# Patient Record
Sex: Male | Born: 1985 | Race: Black or African American | Hispanic: No | Marital: Single | Smoking: Never smoker
Health system: Southern US, Community
[De-identification: ages and names within clinical notes are randomized; demographics above are authoritative.]

---
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
## 2014-11-09 ENCOUNTER — Emergency Department (HOSPITAL_BASED_OUTPATIENT_CLINIC_OR_DEPARTMENT_OTHER)
Admission: EM | Admit: 2014-11-09 | Discharge: 2014-11-09 | Disposition: A | Discharge status: Home / Self Care | Payer: BLUE CROSS/BLUE SHIELD | Attending: Emergency Medicine | Admitting: Emergency Medicine

## 2014-11-09 ENCOUNTER — Emergency Department (HOSPITAL_BASED_OUTPATIENT_CLINIC_OR_DEPARTMENT_OTHER): Payer: BLUE CROSS/BLUE SHIELD

## 2014-11-09 ENCOUNTER — Encounter (HOSPITAL_BASED_OUTPATIENT_CLINIC_OR_DEPARTMENT_OTHER): Payer: Self-pay | Admitting: Emergency Medicine

## 2014-11-09 DIAGNOSIS — L03115 Cellulitis of right lower limb: Secondary | ICD-10-CM | POA: Diagnosis not present

## 2014-11-09 DIAGNOSIS — M79604 Pain in right leg: Secondary | ICD-10-CM

## 2014-11-09 MED ORDER — CEFTRIAXONE SODIUM 1 G IJ SOLR
1.0000 g | Freq: Once | INTRAMUSCULAR | Status: AC
  Administered 2014-11-09: 1 g via INTRAMUSCULAR
  Filled 2014-11-09: qty 10

## 2014-11-09 MED ORDER — LIDOCAINE HCL (PF) 1 % IJ SOLN
INTRAMUSCULAR | Status: AC
  Administered 2014-11-09: 2.1 mL
  Filled 2014-11-09: qty 5

## 2014-11-09 MED ORDER — CEPHALEXIN 500 MG PO CAPS
500.0000 mg | ORAL_CAPSULE | Freq: Four times a day (QID) | ORAL | Status: AC
Start: 2014-11-09 — End: ?

## 2014-11-09 NOTE — ED Notes (Signed)
Patient states that he has had pain to his right lower leg x 3 - 4 days. Patient has redness to his shin and calf area. The patient reports that he "might" has had an injury. The patient reports that he was at [facility] who sent him here for further testing "because they did not know"

## 2014-11-09 NOTE — Discharge Instructions (Signed)
Keflex as prescribed.  Return to the emergency department if your symptoms significantly worsen or change.   Cellulitis Cellulitis is an infection of the skin and the tissue beneath it. The infected area is usually red and tender. Cellulitis occurs most often in the arms and lower legs.  CAUSES  Cellulitis is caused by bacteria that enter the skin through cracks or cuts in the skin. The most common types of bacteria that cause cellulitis are staphylococci and streptococci. SIGNS AND SYMPTOMS   Redness and warmth.  Swelling.  Tenderness or pain.  Fever. DIAGNOSIS  Your health care provider can usually determine what is wrong based on a physical exam. Blood tests may also be done. TREATMENT  Treatment usually involves taking an antibiotic medicine. HOME CARE INSTRUCTIONS   Take your antibiotic medicine as directed by your health care provider. Finish the antibiotic even if you start to feel better.  Keep the infected arm or leg elevated to reduce swelling.  Apply a warm cloth to the affected area up to 4 times per day to relieve pain.  Take medicines only as directed by your health care provider.  Keep all follow-up visits as directed by your health care provider. SEEK MEDICAL CARE IF:   You notice red streaks coming from the infected area.  Your red area gets larger or turns dark in color.  Your bone or joint underneath the infected area becomes painful after the skin has healed.  Your infection returns in the same area or another area.  You notice a swollen bump in the infected area.  You develop new symptoms.  You have a fever. SEEK IMMEDIATE MEDICAL CARE IF:   You feel very sleepy.  You develop vomiting or diarrhea.  You have a general ill feeling (malaise) with muscle aches and pains. MAKE SURE YOU:   Understand these instructions.  Will watch your condition.  Will get help right away if you are not doing well or get worse. Document Released:  04/07/2005 Document Revised: 11/12/2013 Document Reviewed: 09/13/2011 [facility] Patient Information 2015 [employer] [place]. This information is not intended to replace advice given to you by your health care provider. Make sure you discuss any questions you have with your health care provider.

## 2014-11-09 NOTE — ED Provider Notes (Signed)
CSN: [id number]     Arrival date & time 11/09/14  1601 History  This chart was scribed for Dan Lyonsouglas Dan Heatley, MD by Dan Flores, ED Scribe. This patient was seen in room MH11/MH11 and the patient's care was started at 4:24 PM.   Chief Complaint  Patient presents with  . Leg Pain   Patient is a 29 y.o. male presenting with leg pain. The history is provided by the patient. No language interpreter was used.  Leg Pain Location:  Leg Time since incident:  3 days Injury: no   Leg location:  R lower leg Pain details:    Quality:  Aching (redness, swelling)   Radiates to:  Does not radiate   Severity:  Moderate   Onset quality:  Gradual   Timing:  Constant   Progression:  Unchanged Chronicity:  New Dislocation: no   Foreign body present:  No foreign bodies Prior injury to area:  No Relieved by:  Nothing Worsened by:  Nothing tried Ineffective treatments:  None tried Associated symptoms: swelling    HPI Comments: Dan Flores is a 29 y.o. male with no chronic medical conditions, who presents to the Emergency Department complaining of moderate pain to right lower leg with associated redness, swelling and tenderness onset 3-4 days ago. Patient denies associated radiation of pain. Patient states that he was seen by Urgent Care who reported possibility of cellulitis, but did not confirm a diagnosis. Patient was advised to be seen by ER for further testing. Patient is unsure if symptoms are due to an insect bite. He states that he had onset of a "bump at the same time below the site of swelling but I popped it".  History reviewed. No pertinent past medical history. History reviewed. No pertinent past surgical history. History reviewed. No pertinent family history. History  Substance Use Topics  . Smoking status: Never Smoker   . Smokeless tobacco: Not on file  . Alcohol Use: No   Review of Systems  A complete 10 system review of systems was obtained and all systems are negative except  as noted in the HPI and PMH.  Allergies  Review of patient's allergies indicates no known allergies.  Home Medications   Prior to Admission medications   Not on File   Triage Vitals: BP 152/81 mmHg  Pulse 55  Temp(Src) 98.2 F (36.8 C) (Oral)  Resp 18  Ht 5\' 11"  (1.803 m)  Wt 330 lb (149.687 kg)  BMI 46.05 kg/m2  SpO2 98%  Physical Exam  Constitutional: He is oriented to person, place, and time. He appears well-developed and well-nourished. No distress.  HENT:  Head: Normocephalic and atraumatic.  Eyes: Conjunctivae and EOM are normal.  Neck: Neck supple. No tracheal deviation present.  Cardiovascular: Normal rate.   Pulmonary/Chest: Effort normal. No respiratory distress.  Musculoskeletal: Normal range of motion. He exhibits edema and tenderness.  Tenderness to palpation, redness and warmth to the anterior and medial aspect of the right lower leg. DP pulses are palpable. There is no calf tenderness and Homan Sign is absent.  Neurological: He is alert and oriented to person, place, and time. No cranial nerve deficit. Coordination normal.  Skin: Skin is warm and dry.  Psychiatric: He has a normal mood and affect. His behavior is normal.  Nursing note and vitals reviewed.  ED Course  Procedures (including critical care time)  DIAGNOSTIC STUDIES: Oxygen Saturation is 98% on RA, normal by my interpretation.    COORDINATION OF CARE: 4:29 PM- Discussed  plans to order diagnostic ultrasound of right lower leg. Pt advised of plan for treatment and pt agrees.  Labs Review Labs Reviewed - No data to display  Imaging Review No results found.   EKG Interpretation None     MDM   Final diagnoses:  Right leg pain    Ultrasound reveals no evidence for DVT. This appears to be a cellulitis. We'll treat with Rocephin and Keflex and when necessary return.  I personally performed the services described in this documentation, which was scribed in my presence. The recorded  information has been reviewed and is accurate.     Dan Lyons, MD 11/09/14 [id number]

## 2014-11-09 NOTE — ED Notes (Signed)
Patient transported to Ultrasound 

## 2017-06-05 ENCOUNTER — Encounter (HOSPITAL_BASED_OUTPATIENT_CLINIC_OR_DEPARTMENT_OTHER): Payer: Self-pay | Admitting: Emergency Medicine

## 2017-06-05 ENCOUNTER — Emergency Department (HOSPITAL_BASED_OUTPATIENT_CLINIC_OR_DEPARTMENT_OTHER)
Admission: EM | Admit: 2017-06-05 | Discharge: 2017-06-05 | Disposition: A | Discharge status: Home / Self Care | Payer: Self-pay | Attending: Emergency Medicine | Admitting: Emergency Medicine

## 2017-06-05 ENCOUNTER — Other Ambulatory Visit: Payer: Self-pay

## 2017-06-05 ENCOUNTER — Emergency Department (HOSPITAL_BASED_OUTPATIENT_CLINIC_OR_DEPARTMENT_OTHER): Payer: Self-pay

## 2017-06-05 DIAGNOSIS — M25551 Pain in right hip: Secondary | ICD-10-CM | POA: Insufficient documentation

## 2017-06-05 MED ORDER — TRAMADOL HCL 50 MG PO TABS: 50.0000 mg | ORAL_TABLET | Freq: Four times a day (QID) | ORAL | 0 refills | Status: AC | PRN

## 2017-06-05 MED ORDER — IBUPROFEN 800 MG PO TABS: 800.0000 mg | ORAL_TABLET | Freq: Three times a day (TID) | ORAL | 0 refills | Status: AC | PRN

## 2017-06-05 NOTE — ED Triage Notes (Signed)
Patient states that he has had pain to his right hip and down into his right knee x 2 -3 days. Patient re[ports increased pain with movement.

## 2017-06-05 NOTE — ED Notes (Signed)
Pt given d/c instructions as per chart. Rx x 2 with precautions. Verbalizes understanding. No questions. 

## 2017-06-05 NOTE — ED Notes (Addendum)
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 [facility] No meds PTA.

## 2017-06-05 NOTE — Discharge Instructions (Signed)

## 2017-06-05 NOTE — ED Provider Notes (Signed)
Emergency Department Provider Note   I have reviewed the triage vital signs and the nursing notes.   HISTORY  Chief Complaint Hip Pain   HPI Dan Flores is a 31 y.o. male with no significant PMH presents to the ED with right hip pain for the last 3 days.  Patient states that the pain in the right hip is moderate and radiates to the right knee.  It is worse with movement.  He denies any tingling or numbness in the right.  No groin numbness.  No urinary symptoms.  He denies any buttock or lower back pain.  No injury to the leg.  He states that he was in a confined area several days ago and thinks that may have started his discomfort.  He has not been taking medications at home for pain.    History reviewed. No pertinent past medical history.  There are no active problems to display for this patient.   History reviewed. No pertinent surgical history.  Current Outpatient Rx  . Order #: [id number] Class: Print  . Order #: [id number] Class: Print  . Order #: [id number] Class: Print    Allergies Patient has no known allergies.  History reviewed. No pertinent family history.  Social History Social History   Tobacco Use  . Smoking status: Never Smoker  . Smokeless tobacco: Never Used  Substance Use Topics  . Alcohol use: No  . Drug use: No    Review of Systems  Constitutional: No fever/chills Eyes: No visual changes. ENT: No sore throat. Cardiovascular: Denies chest pain. Respiratory: Denies shortness of breath. Gastrointestinal: No abdominal pain.  No nausea, no vomiting.  No diarrhea.  No constipation. Genitourinary: Negative for dysuria. Musculoskeletal: Negative for back pain. Positive right hip and knee pain.  Skin: Negative for rash. Neurological: Negative for headaches, focal weakness or numbness.  10-point ROS otherwise negative.  ____________________________________________   PHYSICAL EXAM:  VITAL SIGNS: ED Triage Vitals  Enc Vitals Group     BP  06/05/17 1951 (!) 152/81     Pulse Rate 06/05/17 1951 81     Resp 06/05/17 1951 16     Temp 06/05/17 1951 99.8 F (37.7 C)     Temp Source 06/05/17 1951 Oral     SpO2 06/05/17 1951 98 %     Weight 06/05/17 1930 (!) 340 lb (154.2 kg)     Height 06/05/17 1930 5\' 11"  (1.803 m)     Pain Score 06/05/17 1930 7   Constitutional: Alert and oriented. Well appearing and in no acute distress. Eyes: Conjunctivae are normal.  Head: Atraumatic. Nose: No congestion/rhinnorhea. Mouth/Throat: Mucous membranes are moist. Neck: No stridor.   Cardiovascular: Good peripheral circulation.   Respiratory: Normal respiratory effort.   Gastrointestinal: No distention.  Musculoskeletal: No lower extremity tenderness nor edema. No gross deformities of extremities. Normal passive and active ROM of the right hip and knee.  Neurologic:  Normal speech and language. No gross focal neurologic deficits are appreciated.  Skin:  Skin is warm, dry and intact. No rash noted.  ____________________________________________  RADIOLOGY  Dg Hip Unilat W Or Wo Pelvis 2-3 Views Right  Result Date: 06/05/2017 CLINICAL DATA:  Acute onset right hip pain x2 days. Worse with abduction. EXAM: DG HIP (WITH OR WITHOUT PELVIS) 2-3V RIGHT COMPARISON:  None. FINDINGS: There is no evidence of hip fracture or dislocation. 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 Mild  degenerative joint space narrowing of both hips. No intra-articular loose bodies, fracture nor bone destruction. No pelvic diastasis. The sacroiliac joints and pubic symphysis are unremarkable. 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 IMPRESSION: 1. 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 This may predispose to  labral tears over time. 2. Slight joint space narrowing of both hips. No acute osseous abnormality. 3. Lumbosacral transitional vertebra. Electronically Signed   By: Tollie Ethavid  Kwon M.D.   On: 06/05/2017 22:22    ____________________________________________   PROCEDURES  Procedure(s) performed:   Procedures  None ____________________________________________   INITIAL IMPRESSION / ASSESSMENT AND PLAN / ED COURSE  Pertinent labs & imaging results that were available during my care of the patient were reviewed by me and considered in my medical decision making (see chart for details).  Patient presents emergency department for evaluation of moderate right hip pain.  No deformity.  No tenderness to palpation of the right knee.  No overlying cellulitis or abscess.  No lower back pain or clinical concern for spinal cord pathology.  Plan for plain film and likely discharge with pain medication and sports medicine follow-up.  Patient's x-ray shows no acute findings but did discuss the results. Referred to outpatient sports medicine for follow up.   At this time, I do not feel there is any life-threatening condition present. I have reviewed and discussed all results (EKG, imaging, lab, urine as appropriate), exam findings with patient. I have reviewed nursing notes and appropriate previous records.  I feel the patient is safe to be discharged home without further emergent workup. Discussed usual and customary return precautions. Patient and family (if present) verbalize understanding and are comfortable with this plan.  Patient will follow-up with their primary care provider. If they do not have a primary care provider, information for follow-up has been provided to them. All questions have been answered.  ____________________________________________  FINAL CLINICAL IMPRESSION(S) / ED DIAGNOSES  Final diagnoses:  Right hip pain     MEDICATIONS GIVEN DURING THIS VISIT:  None  NEW OUTPATIENT  MEDICATIONS STARTED DURING THIS VISIT:  Motrin 800 mg and Tramadol.   Note:  This document was prepared using Dragon voice recognition software and may include unintentional dictation errors.  Alona BeneJoshua Long, MD Emergency Medicine    Long, Arlyss RepressJoshua G, MD 06/05/17 [id number]

## 2019-02-12 IMAGING — CR DG HIP (WITH OR WITHOUT PELVIS) 2-3V*R*
3 series · 3 of 3 positions shown · non-contrast
Comparison: None.

CLINICAL DATA: Acute onset right hip pain x2 days. Worse with
abduction.

EXAM:
DG HIP (WITH OR WITHOUT PELVIS) 2-3V RIGHT

[t pelvis a.p.]
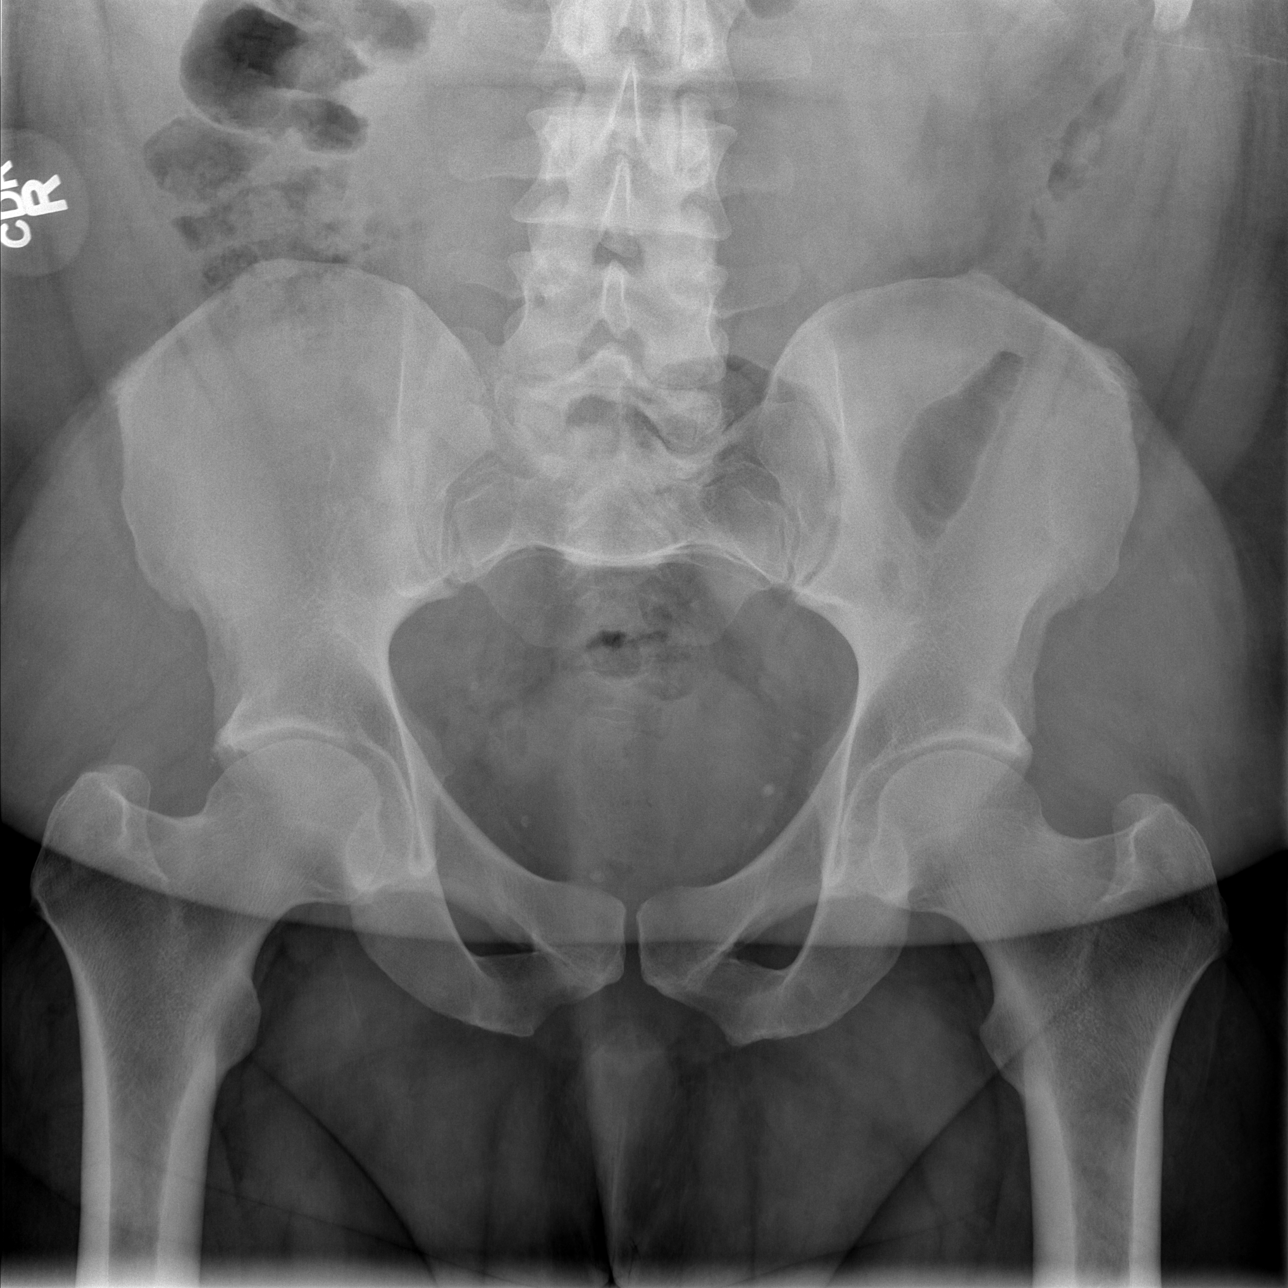

[t hip ap right]
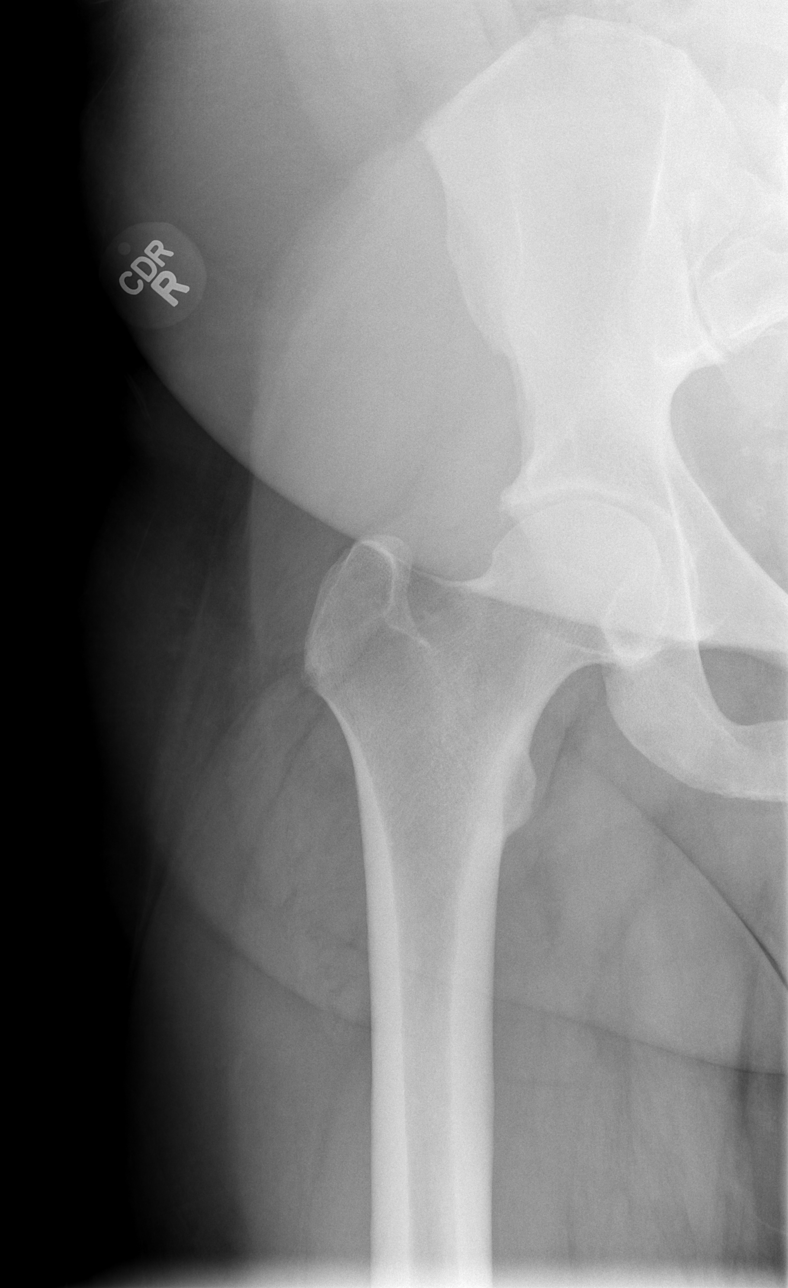

[t hip frog leg right]
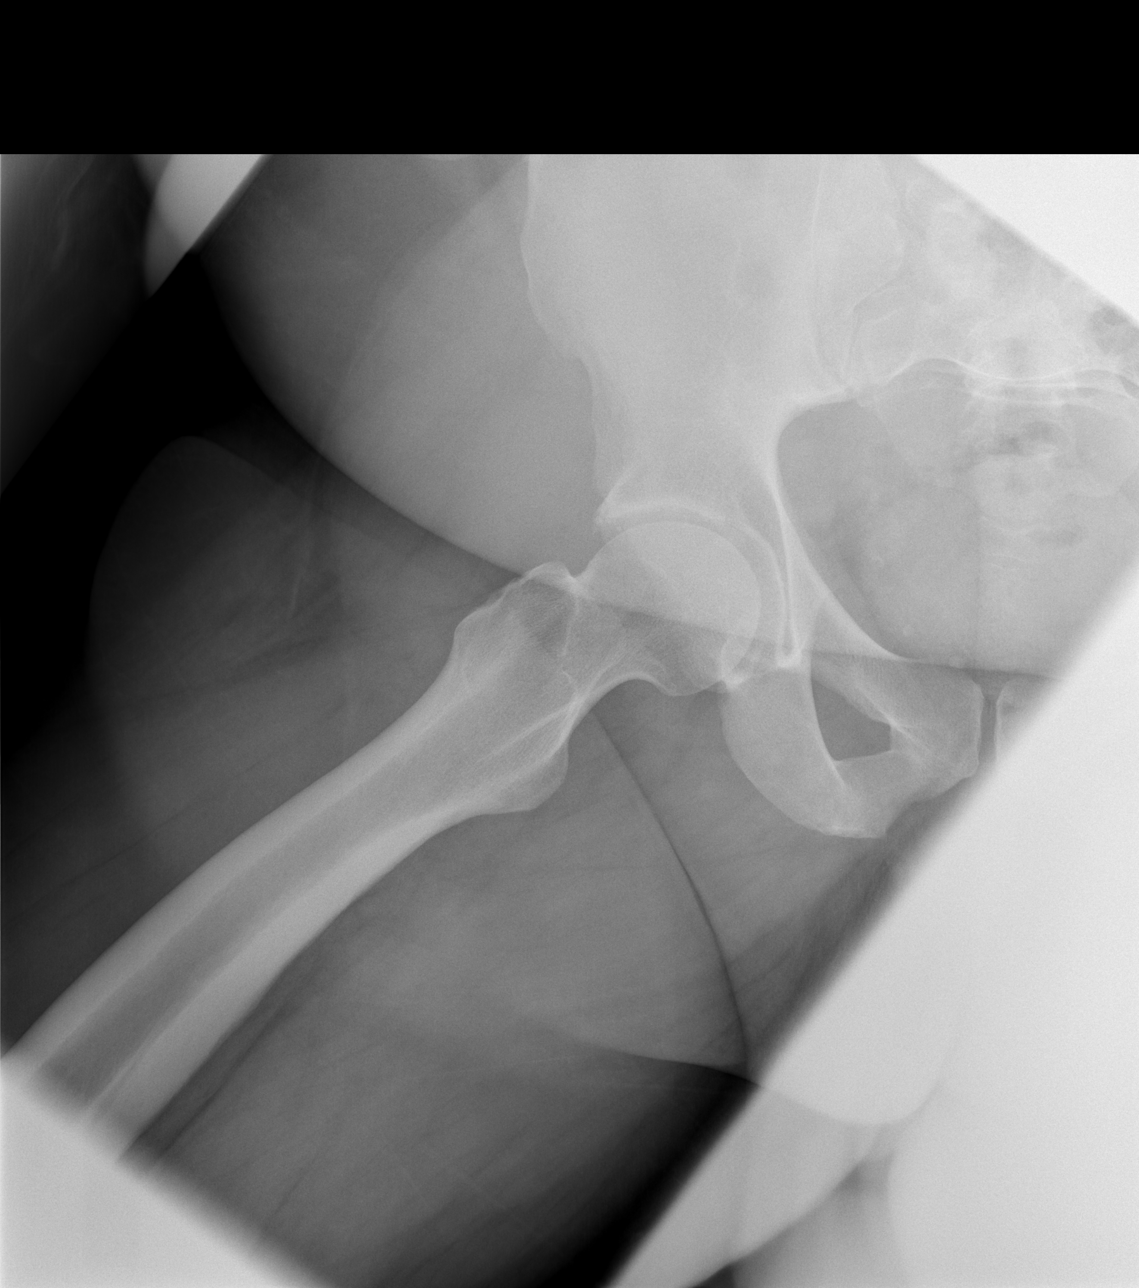

[3 of 3 positions shown; findings below may reference images not displayed]

FINDINGS: There is no evidence of hip fracture or dislocation. Mild bony
protuberance along the femoral head- neck junction superolaterally
of both femora may represent changes of femoroacetabular impingement
of the CAM variety. Mild degenerative joint space narrowing of both
hips. No intra-articular loose bodies, fracture nor bone
destruction. No pelvic diastasis. The sacroiliac joints and pubic
symphysis are unremarkable. Lumbosacral transitional vertebral
anatomy with pseudoarticulation of L5 with S1 on the right.
IMPRESSION: 1. Mild bony protuberance superolaterally along the femoral head-
neck junctions that may reflect changes of femoroacetabular
impingement. This may predispose to labral tears over time.
2. Slight joint space narrowing of both hips. No acute osseous
abnormality.
3. Lumbosacral transitional vertebra.
